# Patient Record
Sex: Female | Born: 1980 | Race: Black or African American | Hispanic: No | Marital: Single | Smoking: Former smoker
Health system: Southern US, Community
[De-identification: ages and names within clinical notes are randomized; demographics above are authoritative.]

## PROBLEM LIST (undated history)

## (undated) DIAGNOSIS — L309 Dermatitis, unspecified: Secondary | ICD-10-CM

## (undated) DIAGNOSIS — O009 Unspecified ectopic pregnancy without intrauterine pregnancy: Secondary | ICD-10-CM

## (undated) HISTORY — DX: Dermatitis, unspecified: L30.9

## (undated) HISTORY — PX: SHOULDER SURGERY: SHX246

---

## 2017-08-13 ENCOUNTER — Emergency Department (HOSPITAL_BASED_OUTPATIENT_CLINIC_OR_DEPARTMENT_OTHER): Payer: Federal, State, Local not specified - PPO

## 2017-08-13 ENCOUNTER — Other Ambulatory Visit: Payer: Self-pay

## 2017-08-13 ENCOUNTER — Encounter (HOSPITAL_BASED_OUTPATIENT_CLINIC_OR_DEPARTMENT_OTHER): Payer: Self-pay | Admitting: *Deleted

## 2017-08-13 ENCOUNTER — Emergency Department (HOSPITAL_BASED_OUTPATIENT_CLINIC_OR_DEPARTMENT_OTHER)
Admission: EM | Admit: 2017-08-13 | Discharge: 2017-08-13 | Disposition: A | Discharge status: Home / Self Care | Payer: Federal, State, Local not specified - PPO | Attending: Emergency Medicine | Admitting: Emergency Medicine

## 2017-08-13 DIAGNOSIS — F17200 Nicotine dependence, unspecified, uncomplicated: Secondary | ICD-10-CM | POA: Insufficient documentation

## 2017-08-13 DIAGNOSIS — S93401A Sprain of unspecified ligament of right ankle, initial encounter: Secondary | ICD-10-CM | POA: Insufficient documentation

## 2017-08-13 DIAGNOSIS — Y998 Other external cause status: Secondary | ICD-10-CM | POA: Insufficient documentation

## 2017-08-13 DIAGNOSIS — X509XXA Other and unspecified overexertion or strenuous movements or postures, initial encounter: Secondary | ICD-10-CM | POA: Diagnosis not present

## 2017-08-13 DIAGNOSIS — M79671 Pain in right foot: Secondary | ICD-10-CM | POA: Diagnosis not present

## 2017-08-13 DIAGNOSIS — Y92480 Sidewalk as the place of occurrence of the external cause: Secondary | ICD-10-CM | POA: Diagnosis not present

## 2017-08-13 DIAGNOSIS — S99911A Unspecified injury of right ankle, initial encounter: Secondary | ICD-10-CM | POA: Diagnosis present

## 2017-08-13 DIAGNOSIS — Y9301 Activity, walking, marching and hiking: Secondary | ICD-10-CM | POA: Diagnosis not present

## 2017-08-13 HISTORY — DX: Unspecified ectopic pregnancy without intrauterine pregnancy: O00.90

## 2017-08-13 MED ORDER — IBUPROFEN 800 MG PO TABS: 800.0000 mg | ORAL_TABLET | Freq: Three times a day (TID) | ORAL | 0 refills | Status: AC | PRN

## 2017-08-13 NOTE — ED Triage Notes (Signed)
Pt reports she stepped down a step wrong and twisted her right ankle this afternoon. No meds PTA.

## 2017-08-13 NOTE — ED Provider Notes (Signed)
Emergency Department Provider Note   I have reviewed the triage vital signs and the nursing notes.   HISTORY  Chief Complaint Ankle Pain   HPI Erika Estes is a 37 y.o. female presents to the emergency department for evaluation of right ankle pain after rolling her ankle while stepping off a step. Denies knee pain but reports some mid-foot pain as well. No bleeding. No fall or head trauma. Patient notes some swelling over the lateral ankle. Pain is moderate and constant. No radiation of symptoms. Pain worse with movement.    Past Medical History:  Diagnosis Date  . Ectopic pregnancy     There are no active problems to display for this patient.   History reviewed. No pertinent surgical history.  Current Outpatient Rx  . Order #: [id number] Class: Historical Med  . Order #: [id number] Class: Print    Allergies Patient has no known allergies.  No family history on file.  Social History Social History   Tobacco Use  . Smoking status: Current Some Day Smoker  Substance Use Topics  . Alcohol use: Yes  . Drug use: Never    Review of Systems  Constitutional: No fever/chills Eyes: No visual changes. ENT: No sore throat. Cardiovascular: Denies chest pain. Respiratory: Denies shortness of breath. Gastrointestinal: No abdominal pain.  No nausea, no vomiting.  No diarrhea.  No constipation. Genitourinary: Negative for dysuria. Musculoskeletal: Negative for back pain. Positive right ankle and foot pain.  Skin: Negative for rash. Neurological: Negative for headaches, focal weakness or numbness.  10-point ROS otherwise negative.  ____________________________________________   PHYSICAL EXAM:  VITAL SIGNS: ED Triage Vitals  Enc Vitals Group     BP 08/13/17 2122 131/77     Pulse Rate 08/13/17 2122 78     Resp 08/13/17 2122 16     Temp 08/13/17 2122 99.1 F (37.3 C)     Temp Source 08/13/17 2122 Oral     SpO2 08/13/17 2122 100 %     Weight 08/13/17 2220 202 lb  (91.6 kg)     Height 08/13/17 2220 5\' 6"  (1.676 m)     Pain Score 08/13/17 2121 8   Constitutional: Alert and oriented. Well appearing and in no acute distress. Eyes: Conjunctivae are normal. Head: Atraumatic. Nose: No congestion/rhinnorhea. Mouth/Throat: Mucous membranes are moist.  Neck: No stridor.  Cardiovascular: Good peripheral circulation. Respiratory: Normal respiratory effort.   Gastrointestinal: No distention.  Musculoskeletal: Right ankle pain with swelling over the lateral malleolus. Pain at the midfoot. No lacerations. No proximal fibular tenderness.  Neurologic:  Normal speech and language. No gross focal neurologic deficits are appreciated.  Skin:  Skin is warm, dry and intact. No rash noted.  ____________________________________________  RADIOLOGY  Dg Ankle Complete Right  Result Date: 08/13/2017 CLINICAL DATA:  Larey SeatFell.  Ankle injury and pain. EXAM: RIGHT ANKLE - COMPLETE 3+ VIEW COMPARISON:  No comparison studies available. FINDINGS: No evidence for an acute fracture. No subluxation or dislocation. Degenerative spurring is seen in the tibiotalar joint. Soft tissue swelling noted laterally and there is a joint effusion. IMPRESSION: No acute bony abnormality. Electronically Signed   By: Kennith CenterEric  Mansell M.D.   On: 08/13/2017 22:03   Dg Foot 2 Views Right  Result Date: 08/13/2017 CLINICAL DATA:  Right foot pain after injury falling down stairs today. EXAM: RIGHT FOOT - 2 VIEW COMPARISON:  Ankle radiographs earlier this day. FINDINGS: There is no evidence of fracture or dislocation. There is no evidence of arthropathy or other focal bone  abnormality. Ankle joint effusion is seen on ankle radiographs. Soft tissues are unremarkable. IMPRESSION: No foot fracture. Electronically Signed   By: Rubye Oaks M.D.   On: 08/13/2017 22:58    ____________________________________________   PROCEDURES  Procedure(s) performed:    Procedures  None ____________________________________________   INITIAL IMPRESSION / ASSESSMENT AND PLAN / ED COURSE  Pertinent labs & imaging results that were available during my care of the patient were reviewed by me and considered in my medical decision making (see chart for details).  Patient with right ankle and pain foot pain. Plain films reviewed with no acute fracture. Splint applied for comfort. No knee tenderness. Advised RICE and Sports Med f/u.   At this time, I do not feel there is any life-threatening condition present. I have reviewed and discussed all results (EKG, imaging, lab, urine as appropriate), exam findings with patient. I have reviewed nursing notes and appropriate previous records.  I feel the patient is safe to be discharged home without further emergent workup. Discussed usual and customary return precautions. Patient and family (if present) verbalize understanding and are comfortable with this plan.  Patient will follow-up with their primary care provider. If they do not have a primary care provider, information for follow-up has been provided to them. All questions have been answered.  ____________________________________________  FINAL CLINICAL IMPRESSION(S) / ED DIAGNOSES  Final diagnoses:  Sprain of right ankle, unspecified ligament, initial encounter  Right foot pain    NEW OUTPATIENT MEDICATIONS STARTED DURING THIS VISIT:  Discharge Medication List as of 08/13/2017 11:07 PM    START taking these medications   Details  ibuprofen (ADVIL,MOTRIN) 800 MG tablet Take 1 tablet (800 mg total) by mouth every 8 (eight) hours as needed., Starting Sat 08/13/2017, Print        Note:  This document was prepared using Dragon voice recognition software and may include unintentional dictation errors.  Alona Bene, MD Emergency Medicine    Elna Radovich, Arlyss Repress, MD 08/14/17 [phone]

## 2017-08-13 NOTE — ED Notes (Signed)
Pt given d/c instructions as per chart. Rx x 1. Verbalizes understanding. No questions. 

## 2017-08-13 NOTE — Discharge Instructions (Signed)
As we discussed, you do not have any broken or dislocated bones in your foot or ankle, but you do have an ankle sprain. There is always a chance that a small fracture did not appear on today's x-ray. Please read through the included information about routine injury care (RICE = rest, ice, compression, elevation), and take over-the-counter pain medicine according to label instructions.  If you do not have any reason to avoid ibuprofen, you can also consider taking ibuprofen 600 mg 3 times a day with meals, but do this for no more than 5 days as it may cause to some stomach discomfort over time.  Use crutches if provided and you may bear weight as tolerated.  Follow-up is recommended with the orthopedic surgeon or with your regular doctor. ° ° °Ankle Sprain °An ankle sprain is an injury to the strong, fibrous tissues (ligaments) that hold the bones of your ankle joint together.  °CAUSES °An ankle sprain is usually caused by a fall or by twisting your ankle. Ankle sprains most commonly occur when you step on the outer edge of your foot, and your ankle turns inward. People who participate in sports are more prone to these types of injuries.  °SYMPTOMS  °Pain in your ankle. The pain may be present at rest or only when you are trying to stand or walk. °Swelling. °Bruising. Bruising may develop immediately or within 1 to 2 days after your injury. °Difficulty standing or walking, particularly when turning corners or changing directions. °DIAGNOSIS  °Your caregiver will ask you details about your injury and perform a physical exam of your ankle to determine if you have an ankle sprain. During the physical exam, your caregiver will press on and apply pressure to specific areas of your foot and ankle. Your caregiver will try to move your ankle in certain ways. An X-ray exam may be done to be sure a bone was not broken or a ligament did not separate from one of the bones in your ankle (avulsion fracture).  °TREATMENT  °Certain  types of braces can help stabilize your ankle. Your caregiver can make a recommendation for this. Your caregiver may recommend the use of medicine for pain. If your sprain is severe, your caregiver may refer you to a surgeon who helps to restore function to parts of your skeletal system (orthopedist) or a physical therapist. °HOME CARE INSTRUCTIONS  °Apply ice to your injury for 1-2 days or as directed by your caregiver. Applying ice helps to reduce inflammation and pain. °Put ice in a plastic bag. °Place a towel between your skin and the bag. °Leave the ice on for 15-20 minutes at a time, every 2 hours while you are awake. °Only take over-the-counter or prescription medicines for pain, discomfort, or fever as directed by your caregiver. °Elevate your injured ankle above the level of your heart as much as possible for 2-3 days. °If your caregiver recommends crutches, use them as instructed. Gradually put weight on the affected ankle. Continue to use crutches or a cane until you can walk without feeling pain in your ankle. °If you have a plaster splint, wear the splint as directed by your caregiver. Do not rest it on anything harder than a pillow for the first 24 hours. Do not put weight on it. Do not get it wet. You may take it off to take a shower or bath. °You may have been given an elastic bandage to wear around your ankle to provide support. If the elastic bandage   is too tight (you have numbness or tingling in your foot or your foot becomes cold and blue), adjust the bandage to make it comfortable. °If you have an air splint, you may blow more air into it or let air out to make it more comfortable. You may take your splint off at night and before taking a shower or bath. Wiggle your toes in the splint several times per day to decrease swelling. °SEEK MEDICAL CARE IF:  °You have rapidly increasing bruising or swelling. °Your toes feel extremely cold or you lose feeling in your foot. °Your pain is not relieved  with medicine. °SEEK IMMEDIATE MEDICAL CARE IF: °Your toes are numb or blue. °You have severe pain that is increasing. °MAKE SURE YOU:  °Understand these instructions. °Will watch your condition. °Will get help right away if you are not doing well or get worse. °  °This information is not intended to replace advice given to you by your health care provider. Make sure you discuss any questions you have with your health care provider. °  °Document Released: 05/10/2005 Document Revised: 05/31/2014 Document Reviewed: 05/22/2011 °Elsevier Interactive Patient Education ©2016 [employer] ° °Elastic Bandage and RICE °WHAT DOES AN ELASTIC BANDAGE DO? °Elastic bandages come in different shapes and sizes. They generally provide support to your injury and reduce swelling while you are healing, but they can perform different functions. Your health care provider will help you to decide what is best for your protection, recovery, or rehabilitation following an injury. °WHAT ARE SOME GENERAL TIPS FOR USING AN ELASTIC BANDAGE? °Use the bandage as directed by the maker of the bandage that you are using. °Do not wrap the bandage too tightly. This may cut off the circulation in the arm or leg in the area below the bandage. °If part of your body beyond the bandage becomes blue, numb, cold, swollen, or is more painful, your bandage is most likely too tight. If this occurs, remove your bandage and reapply it more loosely. °See your health care provider if the bandage seems to be making your problems worse rather than better. °An elastic bandage should be removed and reapplied every 3-4 hours or as directed by your health care provider. °WHAT IS RICE? °The routine care of many injuries includes rest, ice, compression, and elevation (RICE therapy).  °Rest °Rest is required to allow your body to heal. Generally, you can resume your routine activities when you are comfortable and have been given permission by your health care  provider. °Ice °Icing your injury helps to keep the swelling down and it reduces pain. Do not apply ice directly to your skin. °Put ice in a plastic bag. °Place a towel between your skin and the bag. °Leave the ice on for 20 minutes, 2-3 times per day. °Do this for as Webber Michiels as you are directed by your health care provider. °Compression °Compression helps to keep swelling down, gives support, and helps with discomfort. Compression may be done with an elastic bandage. °Elevation °Elevation helps to reduce swelling and it decreases pain. If possible, your injured area should be placed at or above the level of your heart or the center of your chest. °WHEN SHOULD I SEEK MEDICAL CARE? °You should seek medical care if: °You have persistent pain and swelling. °Your symptoms are getting worse rather than improving. °These symptoms may indicate that further evaluation or further X-rays are needed. Sometimes, X-rays may not show a small broken bone (fracture) until a number of days later. Make   a follow-up appointment with your health care provider. Ask when your X-ray results will be ready. Make sure that you get your X-ray results. °WHEN SHOULD I SEEK IMMEDIATE MEDICAL CARE? °You should seek immediate medical care if: °You have a sudden onset of severe pain at or below the area of your injury. °You develop redness or increased swelling around your injury. °You have tingling or numbness at or below the area of your injury that does not improve after you remove the elastic bandage. °  °This information is not intended to replace advice given to you by your health care provider. Make sure you discuss any questions you have with your health care provider. °  °Document Released: 10/30/2001 Document Revised: 01/29/2015 Document Reviewed: 12/24/2013 °Elsevier Interactive Patient Education ©2016 [employer] ° °  °

## 2017-08-13 NOTE — ED Notes (Addendum)
Pt states she stepped off a step and turned her right ankle about 4:30 this afternoon. C/O pain to lateral aspect of right ankle and also c/o pain to right foot. +dpp palp. Moves toes. Feels touch. Cap refill < 3 sec. EDP advised and additional films ordered.

## 2017-08-13 NOTE — ED Notes (Signed)
Patient transported to X-ray 

## 2018-04-23 ENCOUNTER — Emergency Department (HOSPITAL_BASED_OUTPATIENT_CLINIC_OR_DEPARTMENT_OTHER): Payer: Federal, State, Local not specified - PPO

## 2018-04-23 ENCOUNTER — Encounter (HOSPITAL_BASED_OUTPATIENT_CLINIC_OR_DEPARTMENT_OTHER): Payer: Self-pay | Admitting: Emergency Medicine

## 2018-04-23 ENCOUNTER — Emergency Department (HOSPITAL_BASED_OUTPATIENT_CLINIC_OR_DEPARTMENT_OTHER)
Admission: EM | Admit: 2018-04-23 | Discharge: 2018-04-24 | Disposition: A | Discharge status: Home / Self Care | Payer: Federal, State, Local not specified - PPO | Attending: Emergency Medicine | Admitting: Emergency Medicine

## 2018-04-23 ENCOUNTER — Other Ambulatory Visit: Payer: Self-pay

## 2018-04-23 DIAGNOSIS — R079 Chest pain, unspecified: Secondary | ICD-10-CM | POA: Diagnosis present

## 2018-04-23 DIAGNOSIS — Z87891 Personal history of nicotine dependence: Secondary | ICD-10-CM | POA: Diagnosis not present

## 2018-04-23 DIAGNOSIS — T782XXA Anaphylactic shock, unspecified, initial encounter: Secondary | ICD-10-CM | POA: Insufficient documentation

## 2018-04-23 LAB — BASIC METABOLIC PANEL
Anion gap: 12 (ref 5–15)
BUN: 20 mg/dL (ref 6–20)
CHLORIDE: 103 mmol/L (ref 98–111)
CO2: 20 mmol/L — AB (ref 22–32)
CREATININE: 1.1 mg/dL — AB (ref 0.44–1.00)
Calcium: 9.4 mg/dL (ref 8.9–10.3)
GFR calc Af Amer: >60 mL/min (ref >60)
GFR calc non Af Amer: >60 mL/min (ref >60)
Glucose, Bld: 149 mg/dL — ABNORMAL HIGH (ref 70–99)
POTASSIUM: 3.4 mmol/L — AB (ref 3.5–5.1)
Sodium: 135 mmol/L (ref 135–145)

## 2018-04-23 LAB — PREGNANCY, URINE: Preg Test, Ur: NEGATIVE

## 2018-04-23 LAB — CBC WITH DIFFERENTIAL/PLATELET
Abs Immature Granulocytes: 0.13 10*3/uL — ABNORMAL HIGH (ref 0.00–0.07)
Basophils Absolute: 0.1 10*3/uL (ref 0.0–0.1)
Basophils Relative: 0 %
EOS PCT: 0 %
Eosinophils Absolute: 0.1 10*3/uL (ref 0.0–0.5)
HEMATOCRIT: 50.9 % — AB (ref 36.0–46.0)
HEMOGLOBIN: 16.5 g/dL — AB (ref 12.0–15.0)
Immature Granulocytes: 1 %
LYMPHS ABS: 2.1 10*3/uL (ref 0.7–4.0)
LYMPHS PCT: 9 %
MCH: 28.1 pg (ref 26.0–34.0)
MCHC: 32.4 g/dL (ref 30.0–36.0)
MCV: 86.6 fL (ref 80.0–100.0)
MONO ABS: 1.3 10*3/uL — AB (ref 0.1–1.0)
MONOS PCT: 6 %
Neutro Abs: 19.4 10*3/uL — ABNORMAL HIGH (ref 1.7–7.7)
Neutrophils Relative %: 84 %
Platelets: 193 10*3/uL (ref 150–400)
RBC: 5.88 MIL/uL — ABNORMAL HIGH (ref 3.87–5.11)
RDW: 13.2 % (ref 11.5–15.5)
WBC: 23.1 10*3/uL — ABNORMAL HIGH (ref 4.0–10.5)
nRBC: 0 % (ref 0.0–0.2)

## 2018-04-23 MED ORDER — FAMOTIDINE IN NACL 20-0.9 MG/50ML-% IV SOLN
20.0000 mg | Freq: Once | INTRAVENOUS | Status: AC
  Administered 2018-04-23: 20 mg via INTRAVENOUS
  Filled 2018-04-23: qty 50

## 2018-04-23 MED ORDER — SODIUM CHLORIDE 0.9 % IV BOLUS
1000.0000 mL | Freq: Once | INTRAVENOUS | Status: AC
Start: 2018-04-23 — End: 2018-04-23
  Administered 2018-04-23: 1000 mL via INTRAVENOUS

## 2018-04-23 MED ORDER — SODIUM CHLORIDE 0.9 % IV BOLUS
1000.0000 mL | Freq: Once | INTRAVENOUS | Status: AC
  Administered 2018-04-23: 1000 mL via INTRAVENOUS

## 2018-04-23 MED ORDER — DIPHENHYDRAMINE HCL 50 MG/ML IJ SOLN
25.0000 mg | Freq: Once | INTRAMUSCULAR | Status: AC
Start: 2018-04-23 — End: 2018-04-23
  Administered 2018-04-23: 25 mg via INTRAVENOUS
  Filled 2018-04-23: qty 1

## 2018-04-23 MED ORDER — EPINEPHRINE 0.3 MG/0.3ML IJ SOAJ: 0.3000 mg | Freq: Once | INTRAMUSCULAR | 0 refills | Status: AC

## 2018-04-23 MED ORDER — FAMOTIDINE 20 MG PO TABS: 20.0000 mg | ORAL_TABLET | Freq: Every day | ORAL | 0 refills | Status: AC

## 2018-04-23 MED ORDER — SODIUM CHLORIDE 0.9 % IV SOLN
INTRAVENOUS | Status: DC
Start: 2018-04-23 — End: 2018-04-24
  Administered 2018-04-23: 22:00:00 via INTRAVENOUS

## 2018-04-23 MED ORDER — DEXAMETHASONE SODIUM PHOSPHATE 10 MG/ML IJ SOLN
10.0000 mg | Freq: Once | INTRAMUSCULAR | Status: AC
Start: 2018-04-23 — End: 2018-04-23
  Administered 2018-04-23: 10 mg via INTRAVENOUS
  Filled 2018-04-23: qty 1

## 2018-04-23 MED ORDER — EPINEPHRINE 0.3 MG/0.3ML IJ SOAJ
0.3000 mg | Freq: Once | INTRAMUSCULAR | Status: AC
  Administered 2018-04-23: 0.3 mg via INTRAMUSCULAR
  Filled 2018-04-23: qty 0.3

## 2018-04-23 NOTE — ED Notes (Signed)
ED Provider at bedside. 

## 2018-04-23 NOTE — ED Notes (Signed)
Pt is lying flat for orthostatic vital signs.

## 2018-04-23 NOTE — ED Triage Notes (Signed)
C/o SHOB, redness/rash, diarrhea, vomiting. Pt took 50mg  Benadryl ~1730. Tachy on arrival, tachypnic. BIL BS clear.

## 2018-04-24 NOTE — Discharge Instructions (Signed)
Take Pepcid daily. If you start to develop difficulty breathing, swelling of your lips/tongue/throat, hives, or recurrent severe allergic reaction symptoms, you may use the EpiPen.  If you do use the EpiPen, go to the ER for further evaluation. Make sure your are drinking plenty and eating regularly. Follow up with your primary care doctor for further evaluation of your symptoms.  Follow-up with the allergy center for further evaluation. Return to the emergency room with any new, worsening, concerning symptoms.

## 2018-04-24 NOTE — ED Provider Notes (Signed)
[facility] EMERGENCY DEPARTMENT Provider Note   CSN: [id number] Arrival date & time: 04/23/18  1951     History   Chief Complaint Chief Complaint  Patient presents with  . Allergic Reaction    HPI Erika Estes is a 37 y.o. female ending for evaluation of allergic reaction.  Patient states around 530 (2.5 hrs PTA) she was helping her friend decorate a tree when she felt acute onset itching.  She had actually worsening chest tightness and difficulty breathing.  She had associated nausea and vomiting, noticed hives in her scalp, around her neck, upper back.  She immediately showered to remove any contact with the pine needles, and changed clothes.  She took 50 mg of Benadryl without improvement of her symptoms.  She denies history of allergic reaction or anaphylaxis.  She has no known allergies.  Additionally, patient states she is feeling tingles of her distal fingers.  She reports some mild dizziness, worse when going from sitting to standing.  Patient states she has not had much to eat today, had some cereal this morning and nothing to drink.  HPI  Past Medical History:  Diagnosis Date  . Ectopic pregnancy     There are no active problems to display for this patient.   Past Surgical History:  Procedure Laterality Date  . SHOULDER SURGERY       OB History   None      Home Medications    Prior to Admission medications   Medication Sig Start Date End Date Taking? Authorizing Provider  EPINEPHrine 0.3 mg/0.3 mL IJ SOAJ injection Inject 0.3 mLs (0.3 mg total) into the muscle once for 1 dose. 04/24/18 04/24/18  Octavius Shin, PA-C  famotidine (PEPCID) 20 MG tablet Take 1 tablet (20 mg total) by mouth daily. 04/23/18   Verenise Moulin, PA-C  ibuprofen (ADVIL,MOTRIN) 800 MG tablet Take 1 tablet (800 mg total) by mouth every 8 (eight) hours as needed. 08/13/17   Long, Arlyss Repress, MD  VITAMIN D, ERGOCALCIFEROL, PO Take by mouth 1 day or 1 dose. Weekly for 8 weeks     [provider]    Family History History reviewed. No pertinent family history.  Social History Social History   Tobacco Use  . Smoking status: Former Smoker    Last attempt to quit: 10/22/2017    Years since quitting: 0.5  . Smokeless tobacco: Never Used  Substance Use Topics  . Alcohol use: Yes  . Drug use: Never     Allergies   Patient has no known allergies.   Review of Systems Review of Systems  Respiratory: Positive for chest tightness and shortness of breath.   Gastrointestinal: Positive for vomiting.  Skin: Positive for rash.  Neurological: Positive for dizziness.  All other systems reviewed and are negative.    Physical Exam Updated Vital Signs BP 115/83   Pulse 80   Resp 16   Ht 5\' 6"  (1.676 m)   Wt 81.6 kg   SpO2 98%   BMI 29.05 kg/m   Physical Exam  Constitutional: She is oriented to person, place, and time. She appears well-developed and well-nourished. No distress.  Patient appears in distress. If tachypneic and working hard to breath,. Appears very anxious  HENT:  Head: Normocephalic and atraumatic.  No mouth, lip, or tongue swelling.  Airway intact.  OP clear without tonsillar swelling or exudate.  Uvula midline with ago palate rise.  Eyes: Pupils are equal, round, and reactive to light. Conjunctivae and EOM  are normal.  Neck: Normal range of motion. Neck supple.  Cardiovascular: Normal rate, regular rhythm and intact distal pulses.  Pulmonary/Chest: Breath sounds normal. Accessory muscle usage present. Tachypnea noted. She has no wheezes.  There are lung sounds in all fields.  Patient is tachypneic and has mild increased accessory muscle use.  Abdominal: Soft. She exhibits no distension and no mass. There is no tenderness. There is no rebound and no guarding.  Musculoskeletal: Normal range of motion.  Neurological: She is alert and oriented to person, place, and time.  Skin: Skin is warm and dry. Capillary refill takes less than 2  seconds. Rash noted.  Mild hives noted of the upper back, around the neck, and going into the scalp.  Psychiatric: Her mood appears anxious.  Nursing note and vitals reviewed.    ED Treatments / Results  Labs (all labs ordered are listed, but only abnormal results are displayed) Labs Reviewed  CBC WITH DIFFERENTIAL/PLATELET - Abnormal; Notable for the following components:      Result Value   WBC 23.1 (*)    RBC 5.88 (*)    Hemoglobin 16.5 (*)    HCT 50.9 (*)    Neutro Abs 19.4 (*)    Monocytes Absolute 1.3 (*)    Abs Immature Granulocytes 0.13 (*)    All other components within normal limits  BASIC METABOLIC PANEL - Abnormal; Notable for the following components:   Potassium 3.4 (*)    CO2 20 (*)    Glucose, Bld 149 (*)    Creatinine, Ser 1.10 (*)    All other components within normal limits  PREGNANCY, URINE    EKG None  Radiology Dg Chest 2 View  Result Date: 04/23/2018 CLINICAL DATA:  Shortness of breath with diarrhea, vomiting and rash. EXAM: CHEST - 2 VIEW COMPARISON:  None. FINDINGS: The heart size and mediastinal contours are within normal limits. Both lungs are clear. The visualized skeletal structures are unremarkable. IMPRESSION: No active cardiopulmonary disease. Electronically Signed   By: Elberta Fortis M.D.   On: 04/23/2018 21:04    Procedures .Critical Care Performed by: Alveria Apley, PA-C Authorized by: Alveria Apley, PA-C   Critical care provider statement:    Critical care time (minutes):  50   Critical care time was exclusive of:  Separately billable procedures and treating other patients and teaching time   Critical care was necessary to treat or prevent imminent or life-threatening deterioration of the following conditions:  Respiratory failure   Critical care was time spent personally by me on the following activities:  Blood draw for specimens, development of treatment plan with patient or surrogate, evaluation of patient's response to  treatment, examination of patient, obtaining history from patient or surrogate, ordering and performing treatments and interventions, ordering and review of laboratory studies, ordering and review of radiographic studies, pulse oximetry, re-evaluation of patient's condition and review of old charts   I assumed direction of critical care for this patient from another provider in my specialty: no   Comments:     Pt presenting in mild distress with anaphylaxis. Epi, IV decadron, benadyl and pepcid started.    (including critical care time)  Medications Ordered in ED Medications  sodium chloride 0.9 % bolus 1,000 mL (0 mLs Intravenous Stopped 04/23/18 2142)    And  0.9 %  sodium chloride infusion ( Intravenous Stopped 04/23/18 2253)  diphenhydrAMINE (BENADRYL) injection 25 mg (25 mg Intravenous Given 04/23/18 2028)  EPINEPHrine (EPI-PEN) injection 0.3 mg (0.3 mg Intramuscular  Given 04/23/18 2021)  dexamethasone (DECADRON) injection 10 mg (10 mg Intravenous Given 04/23/18 2026)  famotidine (PEPCID) IVPB 20 mg premix (0 mg Intravenous Stopped 04/23/18 2100)  sodium chloride 0.9 % bolus 1,000 mL (0 mLs Intravenous Stopped 04/23/18 2356)     Initial Impression / Assessment and Plan / ED Course  I have reviewed the triage vital signs and the nursing notes.  Pertinent labs & imaging results that were available during my care of the patient were reviewed by me and considered in my medical decision making (see chart for details).     Patient presenting for evaluation of allergic reaction.  Physical exam concerning, patient is in mild distress showing signs of anaphylactic response.  Epinephrine, IV Decadron, Benadryl, Pepcid started.  Patient's blood pressure is soft, will start a bolus of fluids.  On reassessment, patient is no longer having difficulty breathing.  Reports chest tightness has resolved.  She is no longer tachypneic.  Blood pressure improving with fluids.  Patient reports no further itching.   Will obtain labs, urine pregnancy, and chest x-ray for further evaluation.  Will observe for recurrence of allergic reaction.  Labs with elevated white count, this is likely stress mediated.  No infectious.  Chest x-ray viewed interpreted by me, no pneumonia pneumothorax, effusion, or cardiomegaly.  BMP reassuring.  Pregnancy negative.  Discussed with attending, Dr. Judd Lienelo agrees to plan.  On reassessment, patient reports no further allergic symptoms.  Orthostatics ordered.  Orthostatics without change in vital signs, however patient reports continued dizziness when going from sitting to standing.  Will give second bolus and reassess.  On repeat bolus, patient without further allergic response symptoms.  Dizziness improved.  Discussed with patient importance of follow-up with allergy center.  Will discharge with epinephrine and Pepcid.  Patient to follow-up with her PCP as needed for further evaluation.  Encouraged continued hydration and eating regular meals.  At this time, patient appears safe for discharge.  Return precautions given.  Patient states she understands agrees plan.   Final Clinical Impressions(s) / ED Diagnoses   Final diagnoses:  Anaphylaxis, initial encounter    ED Discharge Orders         Ordered    EPINEPHrine 0.3 mg/0.3 mL IJ SOAJ injection   Once     04/23/18 2359    famotidine (PEPCID) 20 MG tablet  Daily     04/23/18 2359           Alveria ApleyCaccavale, Audwin Semper, PA-C 04/24/18 0016    Geoffery Lyonselo, [place], MD 04/26/18 0700

## 2018-04-26 ENCOUNTER — Encounter: Payer: Self-pay | Admitting: Allergy

## 2018-04-26 ENCOUNTER — Ambulatory Visit: Payer: Federal, State, Local not specified - PPO | Admitting: Allergy

## 2018-04-26 VITALS — BP 118/78 | HR 72 | Resp 16 | Ht 67.0 in | Wt 198.6 lb

## 2018-04-26 DIAGNOSIS — R0602 Shortness of breath: Secondary | ICD-10-CM | POA: Diagnosis not present

## 2018-04-26 DIAGNOSIS — T7840XD Allergy, unspecified, subsequent encounter: Secondary | ICD-10-CM | POA: Diagnosis not present

## 2018-04-26 DIAGNOSIS — T7840XA Allergy, unspecified, initial encounter: Secondary | ICD-10-CM | POA: Insufficient documentation

## 2018-04-26 MED ORDER — ALBUTEROL SULFATE 108 (90 BASE) MCG/ACT IN AEPB: 2.0000 | INHALATION_SPRAY | RESPIRATORY_TRACT | 1 refills | Status: DC | PRN

## 2018-04-26 NOTE — Patient Instructions (Addendum)
  For mild symptoms you can take over the counter antihistamines such as Benadryl and monitor symptoms closely. If symptoms worsen or if you have severe symptoms including breathing issues, throat closure, significant swelling, whole body hives, severe diarrhea and vomiting, lightheadedness then inject epinephrine and seek immediate medical care afterwards.  May use albuterol rescue inhaler 2 puffs or nebulizer every 4 to 6 hours as needed for shortness of breath, chest tightness, coughing, and wheezing. May use albuterol rescue inhaler 2 puffs 5 to 15 minutes prior to strenuous physical activities.  Stay away from live Christmas trees.   Return in 2-3 weeks for allergy testing. Must be off antihistamines for 3-5 days.   If you have any itching or worsening symptoms let us know.

## 2018-04-26 NOTE — Assessment & Plan Note (Addendum)
No history of asthma. However had shortness of breath with above event. She does have some issues with dyspnea with exertion.  Today's spirometry was: normal with no improvement in FEV1 post bronchodilator treatment.  May use albuterol rescue inhaler 2 puffs or nebulizer every 4 to 6 hours as needed for shortness of breath, chest tightness, coughing, and wheezing. May use albuterol rescue inhaler 2 puffs 5 to 15 minutes prior to strenuous physical activities.  Monitor symptoms. If not improved with albuterol use then may need cardiac work up in the future.

## 2018-04-26 NOTE — Progress Notes (Signed)
New Patient Note  RE: Erika Estes Markel MRN: [id number] DOB: 12/20/1980 Date of Office Visit: 04/26/2018  Referring provider: Harvie HeckJackson, Mishi, MD Primary care provider: Harvie HeckJackson, Mishi, MD  Chief Complaint: Allergic Reaction  History of Present Illness: I had the pleasure of seeing Erika Estes Coots for initial evaluation at the [facility] on 04/26/2018. She is a 37 y.o. female, who is referred here by Harvie HeckJackson, Mishi, MD for the evaluation of allergic reaction.   On 04/23/2018 patient was trimming the trunk of a live Christmas tree around 5PM indoors at a friend's house when she started to notice itching of her scalp. She felt some dizziness so she laid down on the floor then she went to the bathroom and had emesis x 1 and was sweating profusely and had diarrhea.   She then started to have pruritus again and broke out in hives from head to torso and had difficulty breathing. She took benadryl 50mg  which did not help. She took a bath and had chills afterwards. She felt tingling in her hands and the SOB was persistent so she went to the ER where she received epinephrine, steroids and benadryl. Pruritus and hives resolved but her breathing is still not back at baseline. Still having some shortness of breath with exertion. Patient had episodes of DOE in the past as well.   Denies any changes in diet, medications, personal care products or recent infections. No history of asthma, allergies or allergic reactions. No recent travel or surgeries. No personal or family history of cardiac disease.  Patient has not eaten much this day. She had a cereal and protein shake which she had before with no issues. Patient was around a live Christmas tree last year with no issues. No bug bites noted. Patient had annual bloodwork with no issues. Usually has low Vitamin D.  Assessment and Plan: Erika Estes is a 37 y.o. female with: Allergic reaction One episode of acute allergic reaction after  exposure/contact with live Christmas tree. Developed pruritus, hives, SOB, emesis, and diarrhea. Treated in the ER with epinephrine, dexamethasone, benadryl and Pepcid with good benefit but breathing is not back at baseline. Denies any history of allergic reactions, asthma, or hives. No issues with live Christmas trees in the past.   Based on clinical history concerned about reaction to the live Christmas tree. Stay away from live Christmas trees and mold.   For mild symptoms you can take over the counter antihistamines such as Benadryl and monitor symptoms closely. If symptoms worsen or if you have severe symptoms including breathing issues, throat closure, significant swelling, whole body hives, severe diarrhea and vomiting, lightheadedness then inject epinephrine and seek immediate medical care afterwards. Demonstrated proper use.  Unable to skin test today due to recent antihistamine intake. Return in 2-3 weeks for skin testing.   Shortness of breath No history of asthma. However had shortness of breath with above event. She does have some issues with dyspnea with exertion.  Today's spirometry was: normal with no improvement in FEV1 post bronchodilator treatment.  May use albuterol rescue inhaler 2 puffs or nebulizer every 4 to 6 hours as needed for shortness of breath, chest tightness, coughing, and wheezing. May use albuterol rescue inhaler 2 puffs 5 to 15 minutes prior to strenuous physical activities.  Monitor symptoms. If not improved with albuterol use then may need cardiac work up in the future.   Return in about 2 weeks (around 05/10/2018) for Skin testing.  Meds ordered this encounter  Medications  . Albuterol Sulfate 108 (90 Base) MCG/ACT AEPB    Sig: Inhale 2 puffs into the lungs every 4 (four) hours as needed.    Dispense:  1 each    Refill:  1   Other allergy screening: Asthma: no Rhino conjunctivitis: no Food allergy: no Medication allergy: no Hymenoptera allergy:  no Urticaria: no Eczema:yes as a child History of recurrent infections suggestive of immunodeficency: no  Diagnostics: Spirometry:  Tracings reviewed. Her effort: Good reproducible efforts. FVC: 3.24L FEV1: 2.84L, 100% predicted FEV1/FVC ratio: 88% Interpretation: Spirometry consistent with normal pattern and no improvement in FEV1 post bronchodilator treatment. Please see scanned spirometry results for details.  Skin Testing: Deferred due to recent antihistamines use.  Past Medical History: Patient Active Problem List   Diagnosis Date Noted  . Shortness of breath 04/26/2018  . Allergic reaction 04/26/2018   Past Medical History:  Diagnosis Date  . Ectopic pregnancy   . Eczema    Past Surgical History: Past Surgical History:  Procedure Laterality Date  . SHOULDER SURGERY     Medication List:  Current Outpatient Medications  Medication Sig Dispense Refill  . ibuprofen (ADVIL,MOTRIN) 800 MG tablet Take 1 tablet (800 mg total) by mouth every 8 (eight) hours as needed. 21 tablet 0  . VITAMIN D, ERGOCALCIFEROL, PO Take by mouth 1 day or 1 dose. Weekly for 8 weeks    . Albuterol Sulfate 108 (90 Base) MCG/ACT AEPB Inhale 2 puffs into the lungs every 4 (four) hours as needed. 1 each 1  . famotidine (PEPCID) 20 MG tablet Take 1 tablet (20 mg total) by mouth daily. (Patient not taking: Reported on 04/26/2018) 30 tablet 0  . propranolol ER (INDERAL LA) 60 MG 24 hr capsule TAKE 1 CAPSULE BY MOUTH EVERY DAY     No current facility-administered medications for this visit.    Allergies: No Known Allergies Social History: Social History   Socioeconomic History  . Marital status: Single    Spouse name: Not on file  . Number of children: Not on file  . Years of education: Not on file  . Highest education level: Not on file  Occupational History  . Not on file  Social Needs  . Financial resource strain: Not on file  . Food insecurity:    Worry: Not on file    Inability: Not on  file  . Transportation needs:    Medical: Not on file    Non-medical: Not on file  Tobacco Use  . Smoking status: Former Smoker    Last attempt to quit: 10/22/2017    Years since quitting: 0.5  . Smokeless tobacco: Never Used  Substance and Sexual Activity  . Alcohol use: Yes  . Drug use: Never  . Sexual activity: Not on file  Lifestyle  . Physical activity:    Days per week: Not on file    Minutes per session: Not on file  . Stress: Not on file  Relationships  . Social connections:    Talks on phone: Not on file    Gets together: Not on file    Attends religious service: Not on file    Active member of club or organization: Not on file    Attends meetings of clubs or organizations: Not on file    Relationship status: Not on file  Other Topics Concern  . Not on file  Social History Narrative  . Not on file   Lives in a townhome. Smoking: denies Occupation: administration  Environmental History: Water Damage/mildew in the house: yes Carpet in the family room: yes Carpet in the bedroom: yes Heating: electric Cooling: central Pet: no  Family History: History reviewed. No pertinent family history. Problem                               Relation Asthma                                   No  Eczema                                No  Food allergy                          No  Allergic rhino conjunctivitis     No   Review of Systems  Constitutional: Negative for appetite change, chills, fever and unexpected weight change.  HENT: Negative for congestion and rhinorrhea.   Eyes: Negative for itching.  Respiratory: Positive for shortness of breath. Negative for cough, chest tightness and wheezing.   Cardiovascular: Negative for chest pain.  Gastrointestinal: Negative for abdominal pain.  Genitourinary: Negative for difficulty urinating.  Skin: Negative for rash.  Allergic/Immunologic: Negative for environmental allergies and food allergies.  Neurological: Negative for  headaches.   Objective: BP 118/78   Pulse 72   Resp 16   Ht 5\' 7"  (1.702 m)   Wt 198 lb 9.6 oz (90.1 kg)   BMI 31.11 kg/m  Body mass index is 31.11 kg/m. Physical Exam  Constitutional: She is oriented to person, place, and time. She appears well-developed and well-nourished.  HENT:  Head: Normocephalic and atraumatic.  Right Ear: External ear normal.  Left Ear: External ear normal.  Nose: Mucosal edema present.  Mouth/Throat: Oropharynx is clear and moist.  Eyes: Conjunctivae and EOM are normal.  Neck: Neck supple.  Cardiovascular: Normal rate, regular rhythm and normal heart sounds. Exam reveals no gallop and no friction rub.  No murmur heard. Pulmonary/Chest: Effort normal and breath sounds normal. She has no wheezes. She has no rales.  Abdominal: Soft. Bowel sounds are normal. There is no tenderness.  Lymphadenopathy:    She has no cervical adenopathy.  Neurological: She is alert and oriented to person, place, and time.  Skin: Skin is warm. No rash noted.  Psychiatric: She has a normal mood and affect. Her behavior is normal.  Nursing note and vitals reviewed.  The plan was reviewed with the patient/family, and all questions/concerned were addressed.  It was my pleasure to see Marycruz today and participate in her care. Please feel free to contact me with any questions or concerns.  Sincerely,  Wyline Mood, DO Allergy & Immunology  [facility] office: [phone] [facility] office:[phone]

## 2018-04-26 NOTE — Assessment & Plan Note (Signed)
One episode of acute allergic reaction after exposure/contact with live Christmas tree. Developed pruritus, hives, SOB, emesis, and diarrhea. Treated in the ER with epinephrine, dexamethasone, benadryl and Pepcid with good benefit but breathing is not back at baseline. Denies any history of allergic reactions, asthma, or hives. No issues with live Christmas trees in the past.   Based on clinical history concerned about reaction to the live Christmas tree. Stay away from live Christmas trees and mold.   For mild symptoms you can take over the counter antihistamines such as Benadryl and monitor symptoms closely. If symptoms worsen or if you have severe symptoms including breathing issues, throat closure, significant swelling, whole body hives, severe diarrhea and vomiting, lightheadedness then inject epinephrine and seek immediate medical care afterwards. Demonstrated proper use.  Unable to skin test today due to recent antihistamine intake. Return in 2-3 weeks for skin testing.

## 2018-05-19 ENCOUNTER — Other Ambulatory Visit: Payer: Self-pay | Admitting: *Deleted

## 2018-05-19 MED ORDER — ALBUTEROL SULFATE 108 (90 BASE) MCG/ACT IN AEPB: 2.0000 | INHALATION_SPRAY | RESPIRATORY_TRACT | 0 refills | Status: DC | PRN

## 2018-05-19 MED ORDER — ALBUTEROL SULFATE 108 (90 BASE) MCG/ACT IN AEPB: 2.0000 | INHALATION_SPRAY | RESPIRATORY_TRACT | 1 refills | Status: AC | PRN

## 2018-05-19 NOTE — Telephone Encounter (Signed)
Spoke with [facility] Ann [facility] [place].  She stated that the [facility] lost patients rx for Albuterol inhaler and is requesting a new rx be faxed along with notes from the office visit to [phone]. Informed her that I will print new rx and fax to [facility] for Dr. Selena BattenKim to sign and fax back to the [facility].

## 2018-05-29 ENCOUNTER — Ambulatory Visit: Payer: Federal, State, Local not specified - PPO | Admitting: Allergy

## 2018-05-29 ENCOUNTER — Encounter: Payer: Self-pay | Admitting: Allergy

## 2018-05-29 VITALS — BP 122/70 | HR 72 | Resp 20

## 2018-05-29 DIAGNOSIS — Z91048 Other nonmedicinal substance allergy status: Secondary | ICD-10-CM | POA: Insufficient documentation

## 2018-05-29 DIAGNOSIS — R0602 Shortness of breath: Secondary | ICD-10-CM

## 2018-05-29 DIAGNOSIS — T7840XD Allergy, unspecified, subsequent encounter: Secondary | ICD-10-CM

## 2018-05-29 NOTE — Patient Instructions (Addendum)
Allergic reaction Mildly positive to mold.   Stay away from live Christmas trees and mold.   For mild symptoms you can take over the counter antihistamines such as Benadryl and monitor symptoms closely. If symptoms worsen or if you have severe symptoms including breathing issues, throat closure, significant swelling, whole body hives, severe diarrhea and vomiting, lightheadedness then inject epinephrine and seek immediate medical care afterwards. Demonstrated proper use.  May use albuterol rescue inhaler 2 puffs or nebulizer every 4 to 6 hours as needed for shortness of breath, chest tightness, coughing, and wheezing. May use albuterol rescue inhaler 2 puffs 5 to 15 minutes prior to strenuous physical activities. Monitor frequency of use.   Follow up as needed.   Mold Control . Mold and fungi can grow on a variety of surfaces provided certain temperature and moisture conditions exist.  . Outdoor molds grow on plants, decaying vegetation and soil. The major outdoor mold, Alternaria and Cladosporium, are found in very high numbers during hot and dry conditions. Generally, a late summer - fall peak is seen for common outdoor fungal spores. Rain will temporarily lower outdoor mold spore count, but counts rise rapidly when the rainy period ends. . The most important indoor molds are Aspergillus and Penicillium. Dark, humid and poorly ventilated basements are ideal sites for mold growth. The next most common sites of mold growth are the bathroom and the kitchen. Outdoor (Seasonal) Mold Control . Use air conditioning and keep windows closed. . Avoid exposure to decaying vegetation. Marland Kitchen. Avoid leaf raking. . Avoid grain handling. . Consider wearing a face mask if working in moldy areas.  Indoor (Perennial) Mold Control  . Maintain humidity below 50%. . Get rid of mold growth on hard surfaces with water, detergent and, if necessary, 5% bleach (do not mix with other cleaners). Then dry the area completely.  If mold covers an area more than 10 square feet, consider hiring an indoor environmental professional. . For clothing, washing with soap and water is best. If moldy items cannot be cleaned and dried, throw them away. . Remove sources e.g. contaminated carpets. . Repair and seal leaking roofs or pipes. Using dehumidifiers in damp basements may be helpful, but empty the water and clean units regularly to prevent mildew from forming. All rooms, especially basements, bathrooms and kitchens, require ventilation and cleaning to deter mold and mildew growth. Avoid carpeting on concrete or damp floors, and storing items in damp areas.

## 2018-05-29 NOTE — Progress Notes (Signed)
Follow Up Note  RE: Erika FriskSharita Estes MRN: [id number] DOB: 11/01/1980 Date of Office Visit: 05/29/2018  Referring provider: Harvie HeckJackson, Mishi, MD Primary care provider: Harvie HeckJackson, Mishi, MD  Chief Complaint: Allergy Testing  History of Present Illness: I had the pleasure of seeing Erika FriskSharita Estes for a follow up visit at the [facility] on 05/29/2018. She is a 38 y.o. female, who is being followed for allergic reaction and SOB. Today she is here for skin testing. Her previous allergy office visit was on 04/26/2018 with Dr. Selena BattenKim.   Allergic reaction No additional reactions since the last visit and she has not been around any live Christmas trees. No issues with breathing and did not use albuterol since the last visit.   Assessment and Plan: Erika Estes is a 38 y.o. female with: Allergic reaction Past history - One episode of acute allergic reaction after exposure/contact with live Christmas tree. Developed pruritus, hives, SOB, emesis, and diarrhea. Treated in the ER with epinephrine, dexamethasone, benadryl and Pepcid with good benefit but breathing is not back at baseline. Denies any history of allergic reactions, asthma, or hives. No issues with live Christmas trees in the past.  Interim history - No additional reactions.  Today's skin testing was mildly positive to molds. Discussed environmental control measures.   Stay away from live Christmas trees and mold.   For mild symptoms you can take over the counter antihistamines such as Benadryl and monitor symptoms closely. If symptoms worsen or if you have severe symptoms including breathing issues, throat closure, significant swelling, whole body hives, severe diarrhea and vomiting, lightheadedness then inject epinephrine and seek immediate medical care afterwards.  Allergy to mold See assessment and plan as above.   Shortness of breath Past history - No history of asthma. However had shortness of breath with above event. She does  have some issues with dyspnea with exertion. 2019 spirometry was: normal with no improvement in FEV1 post bronchodilator treatment. Interim history - No issues with breathing and no albuterol use since last visit.   May use albuterol rescue inhaler 2 puffs or nebulizer every 4 to 6 hours as needed for shortness of breath, chest tightness, coughing, and wheezing. May use albuterol rescue inhaler 2 puffs 5 to 15 minutes prior to strenuous physical activities.  Monitor symptoms. If not improved with albuterol use then may need cardiac work up in the future.   Return if symptoms worsen or fail to improve.  Diagnostics: Skin Testing: Environmental allergy panel. Positive test to: mildly to molds.  Results discussed with patient/family. Airborne Adult Perc - 05/29/18 0847    Time Antigen Placed  0845    Allergen Manufacturer  Waynette ButteryGreer    Location  Back    Number of Test  59    Panel 1  Select    1. Control-Buffer 50% Glycerol  Negative    2. Control-Histamine 1 mg/ml  2+    3. Albumin saline  Negative    4. Bahia  Negative    5. [place]  Negative    6. Johnson  Negative    7. Kentucky Blue  Negative    8. Meadow Fescue  Negative    9. Perennial Rye  Negative    10. Sweet Vernal  Negative    11. Timothy  Negative    12. Cocklebur  Negative    13. Burweed Marshelder  Negative    14. Ragweed, short  Negative    15. Ragweed, Giant  Negative  16. Plantain,  English  Negative    17. Lamb's Quarters  Negative    18. Sheep Sorrell  Negative    19. Rough Pigweed  Negative    20. Marsh Elder, Rough  Negative    21. Mugwort, Common  Negative    22. Ash mix  Negative    23. Birch mix  Negative    24. Beech American  Negative    25. Box, Elder  Negative    26. Cedar, red  Negative    27. Cottonwood, [place]  Negative    28. Elm mix  Negative    29. Hickory mix  Negative    30. Maple mix  Negative    31. Oak, [place] mix  Negative    32. Pecan Pollen  Negative    33. Pine mix  Negative     34. Sycamore Eastern  Negative    35. Walnut, Black Pollen  Negative    36. Alternaria alternata  Negative    37. Cladosporium Herbarum  Negative    38. Aspergillus mix  Negative    39. Penicillium mix  Negative    40. Bipolaris sorokiniana (Helminthosporium)  Negative    41. Drechslera spicifera (Curvularia)  Negative    42. Mucor plumbeus  Negative    43. Fusarium moniliforme  Negative    44. Aureobasidium pullulans (pullulara)  Negative    45. Rhizopus oryzae  Negative    46. Botrytis cinera  Negative    47. Epicoccum nigrum  Negative    48. Phoma betae  Negative    49. Candida Albicans  Negative    50. Trichophyton mentagrophytes  Negative    51. Mite, D Farinae  5,000 AU/ml  Negative    52. Mite, D Pteronyssinus  5,000 AU/ml  Negative    53. Cat Hair 10,000 BAU/ml  Negative    54.  Dog Epithelia  Negative    55. Mixed Feathers  Negative    56. Horse Epithelia  Negative    57. Cockroach, German  Negative    58. Mouse  Negative    59. Tobacco Leaf  Negative     Intradermal - 05/29/18 0907    Time Antigen Placed  1610    Allergen Manufacturer  Waynette Buttery    Location  Arm    Number of Test  6    Intradermal  Select    Control  Negative    Mold 1  Negative    Mold 2  1+    Mold 3  Negative    Mold 4  1+    Mite mix  Negative       Medication List:  Current Outpatient Medications  Medication Sig Dispense Refill  . Albuterol Sulfate 108 (90 Base) MCG/ACT AEPB Inhale 2 puffs into the lungs every 4 (four) hours as needed. 1 each 1  . EPINEPHrine (EPIPEN 2-PAK) 0.3 mg/0.3 mL IJ SOAJ injection Inject 0.3 mg into the muscle as needed.    . famotidine (PEPCID) 20 MG tablet Take 1 tablet (20 mg total) by mouth daily. 30 tablet 0  . ibuprofen (ADVIL,MOTRIN) 800 MG tablet Take 1 tablet (800 mg total) by mouth every 8 (eight) hours as needed. 21 tablet 0  . propranolol ER (INDERAL LA) 60 MG 24 hr capsule TAKE 1 CAPSULE BY MOUTH EVERY DAY    . VITAMIN D, ERGOCALCIFEROL, PO Take by  mouth 1 day or 1 dose. Weekly for 8 weeks     No  current facility-administered medications for this visit.    Allergies: No Known Allergies I reviewed her past medical history, social history, family history, and environmental history and no significant changes have been reported from previous visit on 04/26/2018.  Review of Systems  Constitutional: Negative for appetite change, chills, fever and unexpected weight change.  HENT: Negative for congestion and rhinorrhea.   Eyes: Negative for itching.  Respiratory: Negative for cough, chest tightness, shortness of breath and wheezing.   Cardiovascular: Negative for chest pain.  Gastrointestinal: Negative for abdominal pain.  Genitourinary: Negative for difficulty urinating.  Skin: Negative for rash.  Allergic/Immunologic: Positive for environmental allergies. Negative for food allergies.  Neurological: Negative for headaches.   Objective: BP 122/70   Pulse 72   Resp 20  There is no height or weight on file to calculate BMI. Physical Exam  Constitutional: She is oriented to person, place, and time. She appears well-developed and well-nourished.  HENT:  Head: Normocephalic and atraumatic.  Right Ear: External ear normal.  Left Ear: External ear normal.  Mouth/Throat: Oropharynx is clear and moist.  Eyes: Conjunctivae and EOM are normal.  Neck: Neck supple.  Cardiovascular: Normal rate, regular rhythm and normal heart sounds. Exam reveals no gallop and no friction rub.  No murmur heard. Pulmonary/Chest: Effort normal and breath sounds normal. She has no wheezes. She has no rales.  Abdominal: Soft.  Neurological: She is alert and oriented to person, place, and time.  Skin: Skin is warm. No rash noted.  Psychiatric: She has a normal mood and affect. Her behavior is normal.  Nursing note and vitals reviewed.  Previous notes and tests were reviewed. The plan was reviewed with the patient/family, and all questions/concerned were  addressed.  It was my pleasure to see Erika Estes today and participate in her care. Please feel free to contact me with any questions or concerns.  Sincerely,  Wyline MoodYoon Kim, DO Allergy & Immunology  [facility] [place] office: [phone] [facility] office: [phone]

## 2018-05-29 NOTE — Assessment & Plan Note (Signed)
Past history - No history of asthma. However had shortness of breath with above event. She does have some issues with dyspnea with exertion. 2019 spirometry was: normal with no improvement in FEV1 post bronchodilator treatment. Interim history - No issues with breathing and no albuterol use since last visit.   May use albuterol rescue inhaler 2 puffs or nebulizer every 4 to 6 hours as needed for shortness of breath, chest tightness, coughing, and wheezing. May use albuterol rescue inhaler 2 puffs 5 to 15 minutes prior to strenuous physical activities.  Monitor symptoms. If not improved with albuterol use then may need cardiac work up in the future.

## 2018-05-29 NOTE — Assessment & Plan Note (Signed)
.   See assessment and plan as above. 

## 2018-05-29 NOTE — Assessment & Plan Note (Addendum)
Past history - One episode of acute allergic reaction after exposure/contact with live Christmas tree. Developed pruritus, hives, SOB, emesis, and diarrhea. Treated in the ER with epinephrine, dexamethasone, benadryl and Pepcid with good benefit but breathing is not back at baseline. Denies any history of allergic reactions, asthma, or hives. No issues with live Christmas trees in the past.  Interim history - No additional reactions.  Today's skin testing was mildly positive to molds. Discussed environmental control measures.   Stay away from live Christmas trees and mold.   For mild symptoms you can take over the counter antihistamines such as Benadryl and monitor symptoms closely. If symptoms worsen or if you have severe symptoms including breathing issues, throat closure, significant swelling, whole body hives, severe diarrhea and vomiting, lightheadedness then inject epinephrine and seek immediate medical care afterwards.

## 2020-04-29 IMAGING — CR DG CHEST 2V
2 series · 2 of 2 positions shown · non-contrast
Comparison: None.

CLINICAL DATA: Shortness of breath with diarrhea, vomiting and
rash.

EXAM:
CHEST - 2 VIEW

[w chest pa]
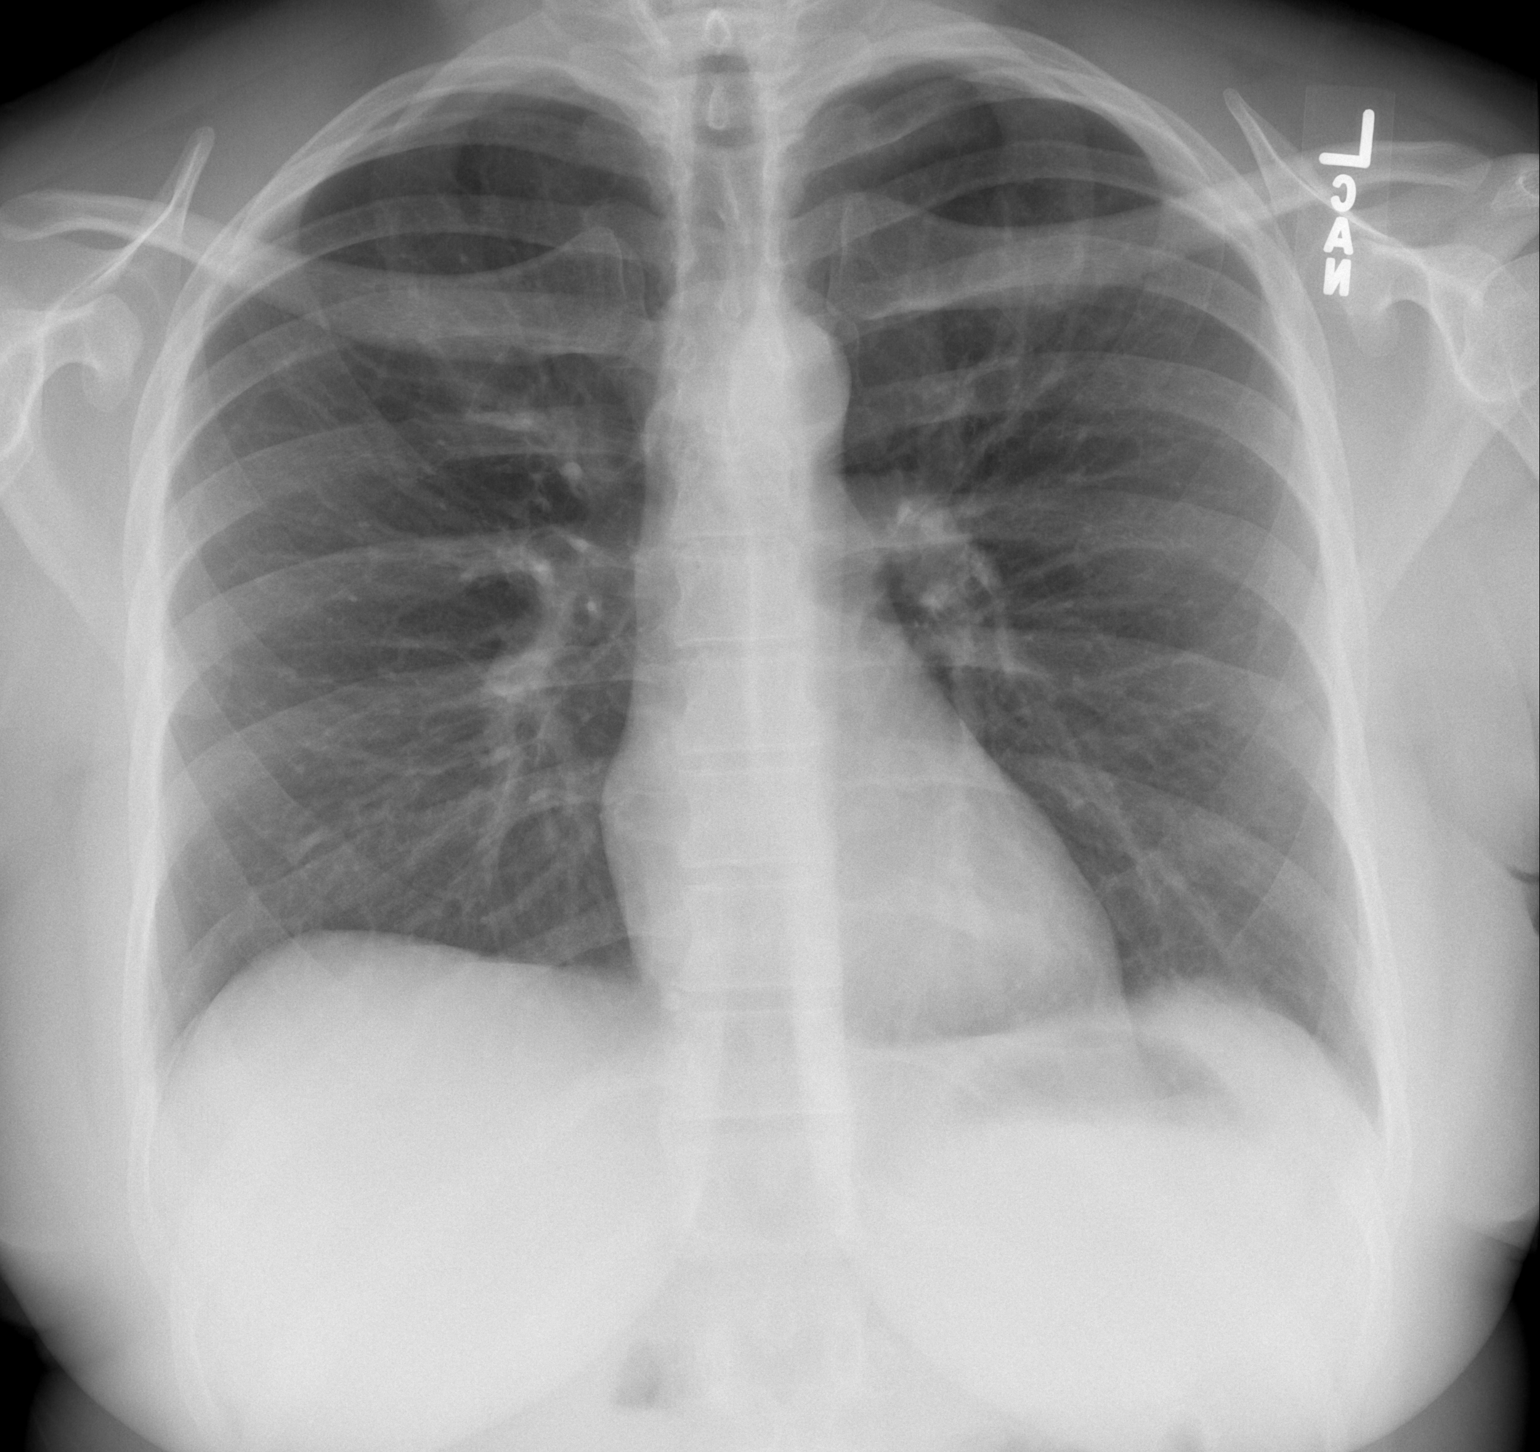

[w chest lat]
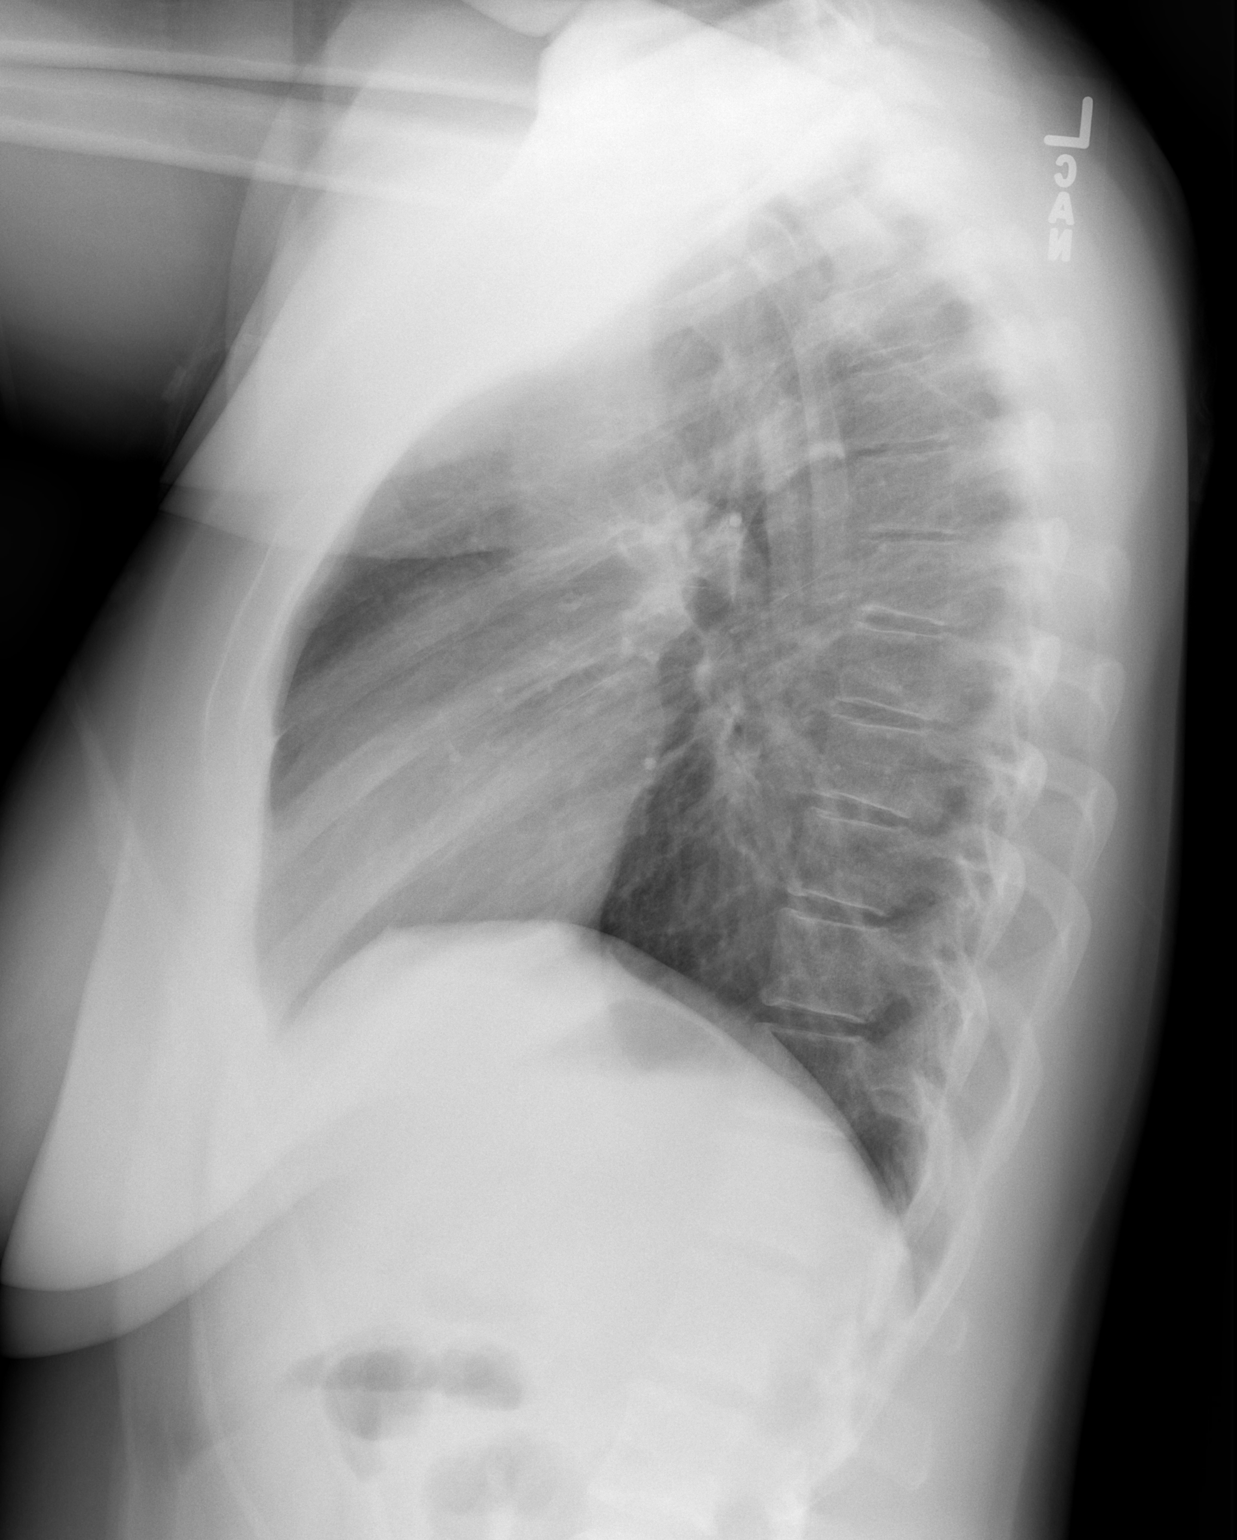

[2 of 2 positions shown; findings below may reference images not displayed]

FINDINGS: The heart size and mediastinal contours are within normal limits.
Both lungs are clear. The visualized skeletal structures are
unremarkable.
IMPRESSION: No active cardiopulmonary disease.
